# Patient Record
Sex: Female | Born: 2000
Health system: Southern US, Community
[De-identification: ages and names within clinical notes are randomized; demographics above are authoritative.]

## PROBLEM LIST (undated history)

## (undated) DIAGNOSIS — F419 Anxiety disorder, unspecified: Secondary | ICD-10-CM

## (undated) DIAGNOSIS — F909 Attention-deficit hyperactivity disorder, unspecified type: Secondary | ICD-10-CM

---
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
## 2021-03-18 DIAGNOSIS — J00 Acute nasopharyngitis [common cold]: Secondary | ICD-10-CM | POA: Diagnosis not present

## 2021-04-13 DIAGNOSIS — H6121 Impacted cerumen, right ear: Secondary | ICD-10-CM | POA: Diagnosis not present

## 2021-04-13 DIAGNOSIS — Z23 Encounter for immunization: Secondary | ICD-10-CM | POA: Diagnosis not present

## 2021-04-13 DIAGNOSIS — Z0000 Encounter for general adult medical examination without abnormal findings: Secondary | ICD-10-CM | POA: Diagnosis not present

## 2021-07-19 DIAGNOSIS — S61412A Laceration without foreign body of left hand, initial encounter: Secondary | ICD-10-CM | POA: Diagnosis not present

## 2021-07-19 DIAGNOSIS — S61432A Puncture wound without foreign body of left hand, initial encounter: Secondary | ICD-10-CM | POA: Diagnosis not present

## 2021-07-19 DIAGNOSIS — Z23 Encounter for immunization: Secondary | ICD-10-CM | POA: Diagnosis not present

## 2021-07-19 DIAGNOSIS — F17290 Nicotine dependence, other tobacco product, uncomplicated: Secondary | ICD-10-CM | POA: Diagnosis not present

## 2021-07-19 DIAGNOSIS — U071 COVID-19: Secondary | ICD-10-CM | POA: Diagnosis not present

## 2021-07-19 DIAGNOSIS — W260XXA Contact with knife, initial encounter: Secondary | ICD-10-CM | POA: Diagnosis not present

## 2021-08-09 DIAGNOSIS — G4700 Insomnia, unspecified: Secondary | ICD-10-CM | POA: Diagnosis not present

## 2021-08-09 DIAGNOSIS — U071 COVID-19: Secondary | ICD-10-CM | POA: Diagnosis not present

## 2021-08-09 DIAGNOSIS — F331 Major depressive disorder, recurrent, moderate: Secondary | ICD-10-CM | POA: Diagnosis not present

## 2021-08-09 DIAGNOSIS — F411 Generalized anxiety disorder: Secondary | ICD-10-CM | POA: Diagnosis not present

## 2022-04-11 DIAGNOSIS — L709 Acne, unspecified: Secondary | ICD-10-CM | POA: Diagnosis not present

## 2022-04-11 DIAGNOSIS — R635 Abnormal weight gain: Secondary | ICD-10-CM | POA: Diagnosis not present

## 2022-05-03 DIAGNOSIS — Z79899 Other long term (current) drug therapy: Secondary | ICD-10-CM | POA: Diagnosis not present

## 2022-05-03 DIAGNOSIS — L700 Acne vulgaris: Secondary | ICD-10-CM | POA: Diagnosis not present

## 2022-05-17 DIAGNOSIS — Z114 Encounter for screening for human immunodeficiency virus [HIV]: Secondary | ICD-10-CM | POA: Diagnosis not present

## 2022-05-17 DIAGNOSIS — Z13220 Encounter for screening for lipoid disorders: Secondary | ICD-10-CM | POA: Diagnosis not present

## 2022-05-17 DIAGNOSIS — Z0000 Encounter for general adult medical examination without abnormal findings: Secondary | ICD-10-CM | POA: Diagnosis not present

## 2022-06-03 DIAGNOSIS — Z79899 Other long term (current) drug therapy: Secondary | ICD-10-CM | POA: Diagnosis not present

## 2022-06-03 DIAGNOSIS — L700 Acne vulgaris: Secondary | ICD-10-CM | POA: Diagnosis not present

## 2022-07-01 DIAGNOSIS — Z0000 Encounter for general adult medical examination without abnormal findings: Secondary | ICD-10-CM | POA: Diagnosis not present

## 2022-07-01 DIAGNOSIS — Z23 Encounter for immunization: Secondary | ICD-10-CM | POA: Diagnosis not present

## 2022-07-08 DIAGNOSIS — L700 Acne vulgaris: Secondary | ICD-10-CM | POA: Diagnosis not present

## 2022-07-08 DIAGNOSIS — Z79899 Other long term (current) drug therapy: Secondary | ICD-10-CM | POA: Diagnosis not present

## 2022-08-08 DIAGNOSIS — Z79899 Other long term (current) drug therapy: Secondary | ICD-10-CM | POA: Diagnosis not present

## 2022-08-08 DIAGNOSIS — L700 Acne vulgaris: Secondary | ICD-10-CM | POA: Diagnosis not present

## 2022-09-12 DIAGNOSIS — L700 Acne vulgaris: Secondary | ICD-10-CM | POA: Diagnosis not present

## 2022-09-12 DIAGNOSIS — Z79899 Other long term (current) drug therapy: Secondary | ICD-10-CM | POA: Diagnosis not present

## 2023-07-26 DIAGNOSIS — Z13220 Encounter for screening for lipoid disorders: Secondary | ICD-10-CM | POA: Diagnosis not present

## 2023-07-26 DIAGNOSIS — Z0000 Encounter for general adult medical examination without abnormal findings: Secondary | ICD-10-CM | POA: Diagnosis not present

## 2023-08-07 DIAGNOSIS — Z01411 Encounter for gynecological examination (general) (routine) with abnormal findings: Secondary | ICD-10-CM | POA: Diagnosis not present

## 2023-08-07 DIAGNOSIS — Z01419 Encounter for gynecological examination (general) (routine) without abnormal findings: Secondary | ICD-10-CM | POA: Diagnosis not present

## 2023-08-07 DIAGNOSIS — Z0000 Encounter for general adult medical examination without abnormal findings: Secondary | ICD-10-CM | POA: Diagnosis not present

## 2023-10-06 DIAGNOSIS — R635 Abnormal weight gain: Secondary | ICD-10-CM | POA: Diagnosis not present

## 2024-06-14 DIAGNOSIS — J069 Acute upper respiratory infection, unspecified: Secondary | ICD-10-CM | POA: Diagnosis not present

## 2024-06-14 DIAGNOSIS — B9689 Other specified bacterial agents as the cause of diseases classified elsewhere: Secondary | ICD-10-CM | POA: Diagnosis not present

## 2024-06-14 DIAGNOSIS — J329 Chronic sinusitis, unspecified: Secondary | ICD-10-CM | POA: Diagnosis not present

## 2024-06-14 DIAGNOSIS — Z1159 Encounter for screening for other viral diseases: Secondary | ICD-10-CM | POA: Diagnosis not present
